# Patient Record
Sex: Female | Born: 1955 | Race: White | Hispanic: No | Marital: Married | Smoking: Never smoker
Health system: Southern US, Community
[De-identification: ages and names within clinical notes are randomized; demographics above are authoritative.]

## PROBLEM LIST (undated history)

## (undated) HISTORY — PX: CHOLECYSTECTOMY: SHX55

---

## 2013-11-04 ENCOUNTER — Emergency Department: Payer: Self-pay | Admitting: Emergency Medicine

## 2013-11-04 LAB — URINALYSIS, COMPLETE
BLOOD: NEGATIVE
Bacteria: NONE SEEN
Bilirubin,UR: NEGATIVE
Glucose,UR: NEGATIVE mg/dL (ref 0–75)
Ketone: NEGATIVE
Leukocyte Esterase: NEGATIVE
NITRITE: NEGATIVE
Ph: 7 (ref 4.5–8.0)
RBC,UR: 1 /HPF (ref 0–5)
SPECIFIC GRAVITY: 1.019 (ref 1.003–1.030)
Squamous Epithelial: 1
WBC UR: 1 /HPF (ref 0–5)

## 2013-11-04 LAB — CBC WITH DIFFERENTIAL/PLATELET
Basophil #: 0 10*3/uL (ref 0.0–0.1)
Basophil %: 0.5 %
EOS ABS: 0.1 10*3/uL (ref 0.0–0.7)
EOS PCT: 1 %
HCT: 44.9 % (ref 35.0–47.0)
HGB: 15.3 g/dL (ref 12.0–16.0)
LYMPHS PCT: 23.4 %
Lymphocyte #: 1.5 10*3/uL (ref 1.0–3.6)
MCH: 31.7 pg (ref 26.0–34.0)
MCHC: 34.1 g/dL (ref 32.0–36.0)
MCV: 93 fL (ref 80–100)
MONO ABS: 0.6 x10 3/mm (ref 0.2–0.9)
Monocyte %: 9.1 %
Neutrophil #: 4.2 10*3/uL (ref 1.4–6.5)
Neutrophil %: 66 %
PLATELETS: 272 10*3/uL (ref 150–440)
RBC: 4.83 10*6/uL (ref 3.80–5.20)
RDW: 13 % (ref 11.5–14.5)
WBC: 6.3 10*3/uL (ref 3.6–11.0)

## 2013-11-04 LAB — BASIC METABOLIC PANEL
Anion Gap: 3 — ABNORMAL LOW (ref 7–16)
BUN: 17 mg/dL (ref 7–18)
CALCIUM: 9 mg/dL (ref 8.5–10.1)
CHLORIDE: 106 mmol/L (ref 98–107)
Co2: 29 mmol/L (ref 21–32)
Creatinine: 0.95 mg/dL (ref 0.60–1.30)
EGFR (African American): >60
GLUCOSE: 90 mg/dL (ref 65–99)
OSMOLALITY: 277 (ref 275–301)
POTASSIUM: 4.6 mmol/L (ref 3.5–5.1)
SODIUM: 138 mmol/L (ref 136–145)

## 2013-11-04 LAB — TROPONIN I: Troponin-I: <0.02 ng/mL

## 2013-11-18 ENCOUNTER — Encounter: Payer: Self-pay | Admitting: Cardiovascular Disease

## 2013-11-18 ENCOUNTER — Encounter (INDEPENDENT_AMBULATORY_CARE_PROVIDER_SITE_OTHER): Payer: Self-pay

## 2013-11-18 ENCOUNTER — Ambulatory Visit (INDEPENDENT_AMBULATORY_CARE_PROVIDER_SITE_OTHER): Payer: PRIVATE HEALTH INSURANCE | Admitting: Cardiovascular Disease

## 2013-11-18 VITALS — BP 130/80 | HR 67 | Ht 68.0 in | Wt 196.4 lb

## 2013-11-18 DIAGNOSIS — R55 Syncope and collapse: Secondary | ICD-10-CM

## 2013-11-18 NOTE — Patient Instructions (Addendum)
Increase your intake of fluids (water with electrolyte tabs like Nun tablets, or gatorade) , protein ( hard boiled eggs, chicken, fish) , and a electrolytes ( V-8 juice, salt, potassium chloride  which is sold as No-Salt   Your physician has recommended that you wear an event monitor. If you do not hear from [facility] with in 7 business days please call me at [phone]. Event monitors are medical devices that record the heart's electrical activity. Doctors most often us these monitors to diagnose arrhythmias. Arrhythmias are problems with the speed or rhythm of the heartbeat. The monitor is a small, portable device. You can wear one while you do your normal daily activities. This is usually used to diagnose what is causing palpitations/syncope (passing out).   Your physician has requested that you have an echocardiogram. Echocardiography is a painless test that uses sound waves to create images of your heart. It provides your doctor with information about the size and shape of your heart and how well your heart's chambers and valves are working. This procedure takes approximately one hour. There are no restrictions for this procedure.   Your physician recommends that you schedule a follow-up appointment in:  3 months

## 2013-11-18 NOTE — Assessment & Plan Note (Signed)
Anne DecemberSharon  presents with an episode of syncope that occurred while she was working out.    the episode occurred fairly suddenly.   It's possible that she was somewhat volume depleted.  She did have a very brief warning.     To rule out serious cardiac condition, we'll place a 30 day event monitor on her .  We will also get an echocardiogram for further evaluation of her syncope. I'll see her again in 3 months. She should continue her exercise regimen. She'll call [facility] if she has any significant problems.

## 2013-11-18 NOTE — Progress Notes (Signed)
     [name] NipperSharon [name] Moore Date of Birth  10/20/1955       [facility]    [place] [facility] [address], [address]  [address], [address] [place], [place]  [id number]   [place], [place]  [id number] [phone]     [phone]   Fax  [phone]     Fax [phone]  Problem List: 1. Syncope   History of Present Illness:  [name] DecemberSharon is a [age] who presents after an episode of syncope. She has had episodes of dehydration and pre-syncope .  Approximately 2 weeks ago she was working out at [employer]. She had walked 2 labs from the gym and then stopped it one of the stations to lift light weights. During that set, she became lightheaded and started "graying out". She loss consciousness. A friend was with her and says that she was out a few seconds.  Was no seizure activity there was no bowel or bladder incontinence.  She did not notice any HR irregularities.  [facility] and her evaluation was unremarkable.   [place] has a [employer] [place].  [place] travels to [place] once a year.  Father had CAD and a cerebral aneurism Brothers had CAD Non smoker, occasional wine/        No current outpatient prescriptions on file prior to visit.   No current facility-administered medications on file prior to visit.    No Known Allergies  No past medical history on file.  No past surgical history on file.  History  Smoking status  . Never Smoker   Smokeless tobacco  . Not on file    History  Alcohol Use: Not on file    No family history on file.  Reviw of Systems:  Reviewed in the HPI.  All other systems are negative.  Physical Exam: Blood pressure 130/80, pulse 67, height 5\' 8"  (1.727 m), weight 196 lb 6.4 oz (89.086 kg). Wt Readings from Last 3 Encounters:  11/18/13 196 lb 6.4 oz (89.086 kg)     General: Well developed, well nourished, in no acute distress.  Head: Normocephalic, atraumatic, sclera non-icteric, mucus membranes are moist,   Neck:  Supple. Carotids are 2 + without bruits. No JVD   Lungs: Clear   Heart: Rr, normal S1S2  Abdomen: Soft, non-tender, non-distended with normal bowel sounds.  Msk:  Strength and tone are normal   Extremities: No clubbing or cyanosis. No edema.  Distal pedal pulses are 2+ and equal    Neuro: CN II - XII intact.  Alert and oriented X 3.   Psych:  Normal  ECG: November 18, 2013:  NSR at 6367, norma   Assessment / Plan:

## 2013-11-25 ENCOUNTER — Ambulatory Visit: Payer: Self-pay | Admitting: Neurology

## 2013-11-26 DIAGNOSIS — R55 Syncope and collapse: Secondary | ICD-10-CM

## 2013-12-05 ENCOUNTER — Other Ambulatory Visit (INDEPENDENT_AMBULATORY_CARE_PROVIDER_SITE_OTHER): Payer: PRIVATE HEALTH INSURANCE

## 2013-12-05 ENCOUNTER — Other Ambulatory Visit: Payer: Self-pay

## 2013-12-05 DIAGNOSIS — R55 Syncope and collapse: Secondary | ICD-10-CM

## 2013-12-16 ENCOUNTER — Ambulatory Visit: Payer: Self-pay | Admitting: Neurology

## 2013-12-16 LAB — CSF CELL COUNT WITH DIFFERENTIAL
CSF TUBE #: 2
Eosinophil: 0 %
LYMPHS PCT: 0 %
MONOCYTES/MACROPHAGES: 0 %
Neutrophils: 0 %
Other Cells: 0 %
RBC (CSF): 0 /mm3
WBC (CSF): 0 /mm3

## 2013-12-16 LAB — CBC WITH DIFFERENTIAL/PLATELET
BASOS PCT: 0.7 %
Basophil #: 0 10*3/uL (ref 0.0–0.1)
Eosinophil #: 0.1 10*3/uL (ref 0.0–0.7)
Eosinophil %: 1.2 %
HCT: 43.3 % (ref 35.0–47.0)
HGB: 14.4 g/dL (ref 12.0–16.0)
LYMPHS ABS: 1.5 10*3/uL (ref 1.0–3.6)
Lymphocyte %: 26.4 %
MCH: 31.1 pg (ref 26.0–34.0)
MCHC: 33.2 g/dL (ref 32.0–36.0)
MCV: 94 fL (ref 80–100)
Monocyte #: 0.5 x10 3/mm (ref 0.2–0.9)
Monocyte %: 8.5 %
NEUTROS PCT: 63.2 %
Neutrophil #: 3.7 10*3/uL (ref 1.4–6.5)
PLATELETS: 260 10*3/uL (ref 150–440)
RBC: 4.62 10*6/uL (ref 3.80–5.20)
RDW: 13.7 % (ref 11.5–14.5)
WBC: 5.9 10*3/uL (ref 3.6–11.0)

## 2013-12-16 LAB — PROTEIN, CSF: Protein, CSF: 31 mg/dL (ref 15–45)

## 2013-12-16 LAB — PROTIME-INR
INR: 0.9
PROTHROMBIN TIME: 12.2 s (ref 11.5–14.7)

## 2013-12-16 LAB — GLUCOSE, CSF: Glucose, CSF: 58 mg/dL (ref 40–75)

## 2013-12-16 LAB — GLUCOSE, RANDOM: GLUCOSE: 91 mg/dL (ref 65–99)

## 2013-12-27 ENCOUNTER — Emergency Department: Payer: Self-pay | Admitting: Emergency Medicine

## 2013-12-27 LAB — BASIC METABOLIC PANEL
ANION GAP: 10 (ref 7–16)
BUN: 14 mg/dL (ref 7–18)
CHLORIDE: 107 mmol/L (ref 98–107)
CO2: 25 mmol/L (ref 21–32)
Calcium, Total: 8.6 mg/dL (ref 8.5–10.1)
Creatinine: 0.78 mg/dL (ref 0.60–1.30)
EGFR (African American): >60
GLUCOSE: 113 mg/dL — AB (ref 65–99)
Osmolality: 284 (ref 275–301)
POTASSIUM: 4.2 mmol/L (ref 3.5–5.1)
Sodium: 142 mmol/L (ref 136–145)

## 2013-12-27 LAB — CBC WITH DIFFERENTIAL/PLATELET
BASOS ABS: 0 10*3/uL (ref 0.0–0.1)
BASOS PCT: 0.4 %
EOS PCT: 1.2 %
Eosinophil #: 0.1 10*3/uL (ref 0.0–0.7)
HCT: 43.5 % (ref 35.0–47.0)
HGB: 14.7 g/dL (ref 12.0–16.0)
Lymphocyte #: 2.1 10*3/uL (ref 1.0–3.6)
Lymphocyte %: 28.4 %
MCH: 31.6 pg (ref 26.0–34.0)
MCHC: 33.8 g/dL (ref 32.0–36.0)
MCV: 93 fL (ref 80–100)
MONOS PCT: 9 %
Monocyte #: 0.7 x10 3/mm (ref 0.2–0.9)
NEUTROS PCT: 61 %
Neutrophil #: 4.6 10*3/uL (ref 1.4–6.5)
Platelet: 262 10*3/uL (ref 150–440)
RBC: 4.66 10*6/uL (ref 3.80–5.20)
RDW: 13.3 % (ref 11.5–14.5)
WBC: 7.5 10*3/uL (ref 3.6–11.0)

## 2013-12-27 LAB — URINALYSIS, COMPLETE
BILIRUBIN, UR: NEGATIVE
BLOOD: NEGATIVE
Bacteria: NONE SEEN
GLUCOSE, UR: NEGATIVE mg/dL (ref 0–75)
KETONE: NEGATIVE
Leukocyte Esterase: NEGATIVE
Nitrite: NEGATIVE
PROTEIN: NEGATIVE
Ph: 7 (ref 4.5–8.0)
SPECIFIC GRAVITY: 1.003 (ref 1.003–1.030)
Squamous Epithelial: <1
WBC UR: 1 /HPF (ref 0–5)

## 2013-12-27 LAB — TROPONIN I

## 2013-12-31 ENCOUNTER — Telehealth: Payer: Self-pay | Admitting: *Deleted

## 2013-12-31 ENCOUNTER — Other Ambulatory Visit: Payer: Self-pay

## 2013-12-31 ENCOUNTER — Ambulatory Visit (INDEPENDENT_AMBULATORY_CARE_PROVIDER_SITE_OTHER): Payer: PRIVATE HEALTH INSURANCE

## 2013-12-31 DIAGNOSIS — R55 Syncope and collapse: Secondary | ICD-10-CM

## 2013-12-31 NOTE — Telephone Encounter (Signed)
Informed patient per Dr. Elease Hashimoto her holter showed normal sinus rhythm with occasional pac's  Patient verbalized understanding

## 2014-02-06 ENCOUNTER — Ambulatory Visit: Payer: PRIVATE HEALTH INSURANCE | Admitting: Cardiovascular Disease

## 2014-03-29 ENCOUNTER — Inpatient Hospital Stay: Payer: Self-pay | Admitting: Internal Medicine

## 2014-03-29 LAB — DIFFERENTIAL
BASOS ABS: 0 10*3/uL (ref 0.0–0.1)
Basophil %: 0.2 %
EOS ABS: 0 10*3/uL (ref 0.0–0.7)
Eosinophil %: 0 %
LYMPHS PCT: 4.6 %
Lymphocyte #: 0.9 10*3/uL — ABNORMAL LOW (ref 1.0–3.6)
MONOS PCT: 5 %
Monocyte #: 1 x10 3/mm — ABNORMAL HIGH (ref 0.2–0.9)
NEUTROS PCT: 90.2 %
Neutrophil #: 18.1 10*3/uL — ABNORMAL HIGH (ref 1.4–6.5)

## 2014-03-29 LAB — BASIC METABOLIC PANEL
Anion Gap: 11 (ref 7–16)
BUN: 16 mg/dL (ref 7–18)
CHLORIDE: 102 mmol/L (ref 98–107)
Calcium, Total: 9.3 mg/dL (ref 8.5–10.1)
Co2: 26 mmol/L (ref 21–32)
Creatinine: 1.14 mg/dL (ref 0.60–1.30)
EGFR (African American): >60
EGFR (Non-African Amer.): 52 — ABNORMAL LOW
Glucose: 272 mg/dL — ABNORMAL HIGH (ref 65–99)
Osmolality: 288 (ref 275–301)
Potassium: 4 mmol/L (ref 3.5–5.1)
SODIUM: 139 mmol/L (ref 136–145)

## 2014-03-29 LAB — HEPATIC FUNCTION PANEL A (ARMC)
ALT: 401 U/L — AB
Albumin: 4.1 g/dL (ref 3.4–5.0)
Alkaline Phosphatase: 96 U/L
Bilirubin, Direct: 0.9 mg/dL — ABNORMAL HIGH (ref 0.0–0.2)
Bilirubin,Total: 2.2 mg/dL — ABNORMAL HIGH (ref 0.2–1.0)
SGOT(AST): 344 U/L — ABNORMAL HIGH (ref 15–37)
TOTAL PROTEIN: 7.5 g/dL (ref 6.4–8.2)

## 2014-03-29 LAB — LIPASE, BLOOD: Lipase: 9312 U/L — ABNORMAL HIGH (ref 73–393)

## 2014-03-29 LAB — CBC
HCT: 54.9 % — ABNORMAL HIGH (ref 35.0–47.0)
HGB: 18.1 g/dL — ABNORMAL HIGH (ref 12.0–16.0)
MCH: 31.1 pg (ref 26.0–34.0)
MCHC: 33 g/dL (ref 32.0–36.0)
MCV: 94 fL (ref 80–100)
PLATELETS: 330 10*3/uL (ref 150–440)
RBC: 5.83 10*6/uL — ABNORMAL HIGH (ref 3.80–5.20)
RDW: 13.1 % (ref 11.5–14.5)
WBC: 20.1 10*3/uL — AB (ref 3.6–11.0)

## 2014-03-29 LAB — TROPONIN I

## 2014-03-30 LAB — COMPREHENSIVE METABOLIC PANEL
ALBUMIN: 2.8 g/dL — AB (ref 3.4–5.0)
ALK PHOS: 82 U/L
ALT: 210 U/L — AB
ANION GAP: 8 (ref 7–16)
AST: 133 U/L — AB (ref 15–37)
BILIRUBIN TOTAL: 0.9 mg/dL (ref 0.2–1.0)
BUN: 18 mg/dL (ref 7–18)
Calcium, Total: 7.9 mg/dL — ABNORMAL LOW (ref 8.5–10.1)
Chloride: 108 mmol/L — ABNORMAL HIGH (ref 98–107)
Co2: 25 mmol/L (ref 21–32)
Creatinine: 1.14 mg/dL (ref 0.60–1.30)
EGFR (Non-African Amer.): 52 — ABNORMAL LOW
Glucose: 275 mg/dL — ABNORMAL HIGH (ref 65–99)
Osmolality: 293 (ref 275–301)
POTASSIUM: 4.7 mmol/L (ref 3.5–5.1)
Sodium: 141 mmol/L (ref 136–145)
Total Protein: 5.7 g/dL — ABNORMAL LOW (ref 6.4–8.2)

## 2014-03-30 LAB — CBC WITH DIFFERENTIAL/PLATELET
Basophil #: 0 10*3/uL (ref 0.0–0.1)
Basophil %: 0.1 %
Eosinophil #: 0 10*3/uL (ref 0.0–0.7)
Eosinophil %: 0 %
HCT: 52 % — ABNORMAL HIGH (ref 35.0–47.0)
HGB: 16.9 g/dL — ABNORMAL HIGH (ref 12.0–16.0)
LYMPHS ABS: 1 10*3/uL (ref 1.0–3.6)
Lymphocyte %: 4.3 %
MCH: 31.2 pg (ref 26.0–34.0)
MCHC: 32.6 g/dL (ref 32.0–36.0)
MCV: 96 fL (ref 80–100)
MONOS PCT: 8.7 %
Monocyte #: 2 x10 3/mm — ABNORMAL HIGH (ref 0.2–0.9)
NEUTROS PCT: 86.9 %
Neutrophil #: 20.2 10*3/uL — ABNORMAL HIGH (ref 1.4–6.5)
PLATELETS: 224 10*3/uL (ref 150–440)
RBC: 5.42 10*6/uL — ABNORMAL HIGH (ref 3.80–5.20)
RDW: 13.5 % (ref 11.5–14.5)
WBC: 23.2 10*3/uL — ABNORMAL HIGH (ref 3.6–11.0)

## 2014-03-30 LAB — LIPID PANEL
CHOLESTEROL: 156 mg/dL (ref 0–200)
HDL: 54 mg/dL (ref 40–60)
Ldl Cholesterol, Calc: 79 mg/dL (ref 0–100)
TRIGLYCERIDES: 114 mg/dL (ref 0–200)
VLDL Cholesterol, Calc: 23 mg/dL (ref 5–40)

## 2014-03-30 LAB — LIPASE, BLOOD: Lipase: 4613 U/L — ABNORMAL HIGH (ref 73–393)

## 2014-03-31 LAB — COMPREHENSIVE METABOLIC PANEL
ANION GAP: 8 (ref 7–16)
AST: 77 U/L — AB (ref 15–37)
Albumin: 2.2 g/dL — ABNORMAL LOW (ref 3.4–5.0)
Alkaline Phosphatase: 78 U/L
BILIRUBIN TOTAL: 1 mg/dL (ref 0.2–1.0)
BUN: 16 mg/dL (ref 7–18)
Calcium, Total: 7.8 mg/dL — ABNORMAL LOW (ref 8.5–10.1)
Chloride: 113 mmol/L — ABNORMAL HIGH (ref 98–107)
Co2: 24 mmol/L (ref 21–32)
Creatinine: 0.89 mg/dL (ref 0.60–1.30)
EGFR (African American): >60
EGFR (Non-African Amer.): >60
GLUCOSE: 153 mg/dL — AB (ref 65–99)
Osmolality: 293 (ref 275–301)
POTASSIUM: 4.5 mmol/L (ref 3.5–5.1)
SGPT (ALT): 117 U/L — ABNORMAL HIGH
Sodium: 145 mmol/L (ref 136–145)
TOTAL PROTEIN: 5.1 g/dL — AB (ref 6.4–8.2)

## 2014-03-31 LAB — MAGNESIUM: Magnesium: 1.9 mg/dL

## 2014-03-31 LAB — LIPASE, BLOOD: LIPASE: 2352 U/L — AB (ref 73–393)

## 2014-04-01 LAB — CBC WITH DIFFERENTIAL/PLATELET
BASOS ABS: 0 10*3/uL (ref 0.0–0.1)
Basophil %: 0.1 %
EOS ABS: 0 10*3/uL (ref 0.0–0.7)
Eosinophil %: 0.1 %
HCT: 37.5 % (ref 35.0–47.0)
HGB: 12.1 g/dL (ref 12.0–16.0)
Lymphocyte #: 0.8 10*3/uL — ABNORMAL LOW (ref 1.0–3.6)
Lymphocyte %: 6.9 %
MCH: 31.3 pg (ref 26.0–34.0)
MCHC: 32.4 g/dL (ref 32.0–36.0)
MCV: 97 fL (ref 80–100)
Monocyte #: 1.2 x10 3/mm — ABNORMAL HIGH (ref 0.2–0.9)
Monocyte %: 10.8 %
NEUTROS PCT: 82.1 %
Neutrophil #: 9.3 10*3/uL — ABNORMAL HIGH (ref 1.4–6.5)
PLATELETS: 142 10*3/uL — AB (ref 150–440)
RBC: 3.88 10*6/uL (ref 3.80–5.20)
RDW: 13.6 % (ref 11.5–14.5)
WBC: 11.3 10*3/uL — ABNORMAL HIGH (ref 3.6–11.0)

## 2014-04-01 LAB — COMPREHENSIVE METABOLIC PANEL
ALK PHOS: 81 U/L
AST: 45 U/L — AB (ref 15–37)
Albumin: 2 g/dL — ABNORMAL LOW (ref 3.4–5.0)
Anion Gap: 4 — ABNORMAL LOW (ref 7–16)
BUN: 11 mg/dL (ref 7–18)
Bilirubin,Total: 0.9 mg/dL (ref 0.2–1.0)
CHLORIDE: 109 mmol/L — AB (ref 98–107)
Calcium, Total: 7.9 mg/dL — ABNORMAL LOW (ref 8.5–10.1)
Co2: 29 mmol/L (ref 21–32)
Creatinine: 0.71 mg/dL (ref 0.60–1.30)
Glucose: 141 mg/dL — ABNORMAL HIGH (ref 65–99)
OSMOLALITY: 285 (ref 275–301)
Potassium: 4 mmol/L (ref 3.5–5.1)
SGPT (ALT): 78 U/L — ABNORMAL HIGH
Sodium: 142 mmol/L (ref 136–145)
Total Protein: 5.1 g/dL — ABNORMAL LOW (ref 6.4–8.2)

## 2014-04-01 LAB — PHOSPHORUS: Phosphorus: 1.6 mg/dL — ABNORMAL LOW (ref 2.5–4.9)

## 2014-04-01 LAB — LIPASE, BLOOD: Lipase: 532 U/L — ABNORMAL HIGH (ref 73–393)

## 2014-04-01 LAB — MAGNESIUM: Magnesium: 2.3 mg/dL

## 2014-04-02 LAB — BASIC METABOLIC PANEL
Anion Gap: 6 — ABNORMAL LOW (ref 7–16)
BUN: 9 mg/dL (ref 7–18)
CALCIUM: 7.6 mg/dL — AB (ref 8.5–10.1)
CO2: 26 mmol/L (ref 21–32)
Chloride: 108 mmol/L — ABNORMAL HIGH (ref 98–107)
Creatinine: 0.63 mg/dL (ref 0.60–1.30)
EGFR (African American): >60
GLUCOSE: 142 mg/dL — AB (ref 65–99)
Osmolality: 281 (ref 275–301)
POTASSIUM: 4.2 mmol/L (ref 3.5–5.1)
SODIUM: 140 mmol/L (ref 136–145)

## 2014-04-02 LAB — PHOSPHORUS: Phosphorus: 1.9 mg/dL — ABNORMAL LOW (ref 2.5–4.9)

## 2014-04-02 LAB — MAGNESIUM: MAGNESIUM: 2.1 mg/dL

## 2014-04-03 LAB — CULTURE, BLOOD (SINGLE)

## 2014-06-16 ENCOUNTER — Ambulatory Visit: Payer: Self-pay | Admitting: Family Medicine

## 2014-08-22 NOTE — Consult Note (Signed)
Chief Complaint:  Subjective/Chief Complaint Pt more alert.  No diffuse abd pain 4/10 currently-responds to pain meds.   VITAL SIGNS/ANCILLARY NOTES: **Vital Signs.:   03-Dec-15 12:00  Temperature Temperature (F) 98.1  Celsius 36.7  Temperature Source oral  Pulse Pulse 109  Respirations Respirations 22  Systolic BP Systolic BP 841  Diastolic BP (mmHg) Diastolic BP (mmHg) 77  Mean BP 104  Pulse Ox % Pulse Ox % 97  Pulse Ox Activity Level  At rest  Oxygen Delivery 2L; Nasal Cannula   Brief Assessment:  GEN well developed, well nourished, critically ill appearing, A/Ox3   Cardiac +mild tachycardia   Respiratory normal resp effort   Gastrointestinal details normal Soft  Decreased BS, +distended, +guarding, +rebound tenderness   EXTR negative cyanosis/clubbing, negative edema   Additional Physical Exam Skin: pink, warm, dry HEENT: Dobhoff intact   Lab Results: Routine Chem:  03-Dec-15 04:02   Glucose, Serum  142  BUN 9  Sodium, Serum 140  Potassium, Serum 4.2  Chloride, Serum  108  CO2, Serum 26  Calcium (Total), Serum  7.6  Anion Gap  6  Osmolality (calc) 281  eGFR (African American) >60  eGFR (Non-African American) >60 (eGFR values <51m/min/1.73 m2 may be an indication of chronic kidney disease (CKD). Calculated eGFR, using the MRDR Study equation, is useful in  patients with stable renal function. The eGFR calculation will not be reliable in acutely ill patients when serum creatinine is changing rapidly. It is not useful in patients on dialysis. The eGFR calculation may not be applicable to patients at the low and high extremes of body sizes, pregnant women, and vegetarians.)  Magnesium, Serum 2.1 (1.8-2.4 THERAPEUTIC RANGE: 4-7 mg/dL TOXIC: > 10 mg/dL  -----------------------)  Phosphorus, Serum  1.9 (Result(s) reported on 02 Apr 2014 at 05:18AM.)   Assessment/Plan:  Assessment/Plan:  Assessment Acute necrotizing pancreatitis:  Likely secondary to  passage of choledocholithiasis.  Pt condition is guarded. Plans for transfer to tertiary care as soon as bed available.   Plan Continue current plan of care & supportive measures [facility] have any [facility]   Electronic Signatures: JAndria Meuse(NP)  (Signed 03-Dec-15 14:11)  Authored: Chief Complaint, VITAL SIGNS/ANCILLARY NOTES, Brief Assessment, Lab Results, Assessment/Plan   Last Updated: 03-Dec-15 14:11 by JAndria Meuse(NP)

## 2014-08-22 NOTE — Consult Note (Signed)
PATIENT NAME:  [name], [name]  DATE OF CONSULTATION:  03/30/2014  REFERRING PHYSICIAN:   CONSULTING PHYSICIAN:  Joselyn ArrowKandice L. Flora Parks, NP  REQUESTING PHYSICIAN:  Dr. Betti Cruzeddy.    CONSULTING GASTROENTEROLOGIST: Dr. Midge Miniumarren Wohl.    PRIMARY CARE PROVIDER: Nonlocal.   REASON FOR CONSULTATION: Acute pancreatitis.   HISTORY OF PRESENT ILLNESS: [name] is a 59 year old Caucasian female who was in her usual state of health, who was awakened at 2:00 a.m. with severe epigastric pain that radiated to her back. She had nausea and vomiting with the episode. She denies any fever or chest pain or shortness of breath. Denies any diarrhea or constipation. Denies any rectal bleeding or melena. She was found to have a lipase in the ER of 9312 yesterday a.m. and down to 4613 today. Her white blood cell count was elevated at 20.1 yesterday, 23.2 today. Hemoglobin was 18.1 yesterday, down to 16.9 today. She has normal platelets. Her LFTs showed a total bilirubin of 2.2, 0.9 direct, AST 344, and ALT 401 yesterday. Bilirubin dropped to 0.9 today, AST dropped to 133, and ALT dropped to 210. Her albumin is 2.8. Her serum glucose was 275 this morning. She had an abdominal ultrasound that showed multiple non-mobile stones in the base of the gallbladder extending into the gallbladder neck that measure 2.2 cm in size. She had thickening of the gallbladder wall to 6 mm, raising concern for mild cholecystitis. She had a common bile duct of 8 mm, raising concern for distal obstruction and mild prominence of the intrahepatic biliary   DICTATION ENDS HERE  (see addendum)    ____________________________ Joselyn ArrowKandice L. Jeremiyah Cullens, NP klj:bu D: 03/30/2014 14:33:18 ET T: 03/30/2014 14:57:05 ET JOB#: [id number]  cc: Joselyn ArrowKandice L. Lasandra Batley, NP, <Dictator> Joselyn ArrowKANDICE L Ignazio Kincaid FNP ELECTRONICALLY SIGNED 04/10/2014 23:50

## 2014-08-22 NOTE — Discharge Summary (Signed)
PATIENT NAME:  [name], [name] MR#:  [id number] DATE OF BIRTH:  31-Mar-1956  DATE OF ADMISSION:  03/29/2014 DATE OF DISCHARGE:  04/02/2014  DISCHARGE DIAGNOSES: 1.  Severe pancreatitis with extensive necrosis, likely gallstone-related.  2.  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  3.  Accelerated hypertension.  SECONDARY DIAGNOSIS: History of vasovagal syncope in July of 2015.   CONSULTATIONS: 1.  Neurology, Dr. Pauletta Browns. 2.  Surgery, Dr. Natale Lay.  3.  Gastroenterology, Dr. Midge Minium.   PROCEDURES AND RADIOLOGY: A chest x-ray on the 29th of November showed no acute cardiopulmonary disease.   CT scan of the abdomen and pelvis with contrast on the 29th of November showed extensive edema in the upper abdomen. Inflammatory changes centered around the pancreas, specifically the pancreatic body, findings compatible with acute pancreatitis. Likely necrosis present in the pancreas. No pseudocyst formation. Cholelithiasis present.   CT scan of the head without contrast on the 29th of November showed no acute abnormality.   MRCP on 30th of November showed no evidence of choledocholithiasis. Gallstone with nonspecific gallbladder mucosal hyperenhancement. Marked pancreatitis with necrosis involving a larger volume of the body, tail and uncinate process. Small bilateral pleural effusion.   Abdomen ultrasound on the 29th of November showed thickening of the gallbladder wall up to 6 mm raising concern for mild cholecystitis. Multiple nonmobile stones at the base of the gallbladder extending into gallbladder neck. Mild prominence of the common hepatic duct and intrahepatic biliary duct raising question for distal obstruction.   Abdominal x-ray on 1st of December showed possible colonic ileus.   Chest x-ray on 1st of December showed feeding tube deep, projecting over the proximal thoracic esophagus, at the level of the  aortic arch. Recommend advancing tube into the stomach to desired position.   KUB on 1st of December showed feeding tube tip in the proximal duodenum.   MAJOR LABORATORY PANEL: Blood cultures x2 were negative.   HISTORY AND SHORT HOSPITAL COURSE: The patient is a 59 year old female with no significant medical problems who was admitted for nausea, vomiting and epigastric pain. Was found to have acute pancreatitis, thought to be due to gallstone. There was also concern for acute cholecystitis with associated cholelithiasis. Please see Dr. Reddy's dictated history and physical for further details. Surgical consultation was obtained with Dr. Reesa Chew. Natale Lay who recommended conservative management and eventual cholecystectomy once pancreatitis resolved with no emergent surgery. A GI consultation was obtained with Dr. Midge Minium who recommended MRCP for evaluation of choledocholithiasis, which was performed with results dictated above. They also recommended conservative management and feeding tube placement for rest of the pancreas and bowel. The patient has significant pancreatic necrosis with severe ongoing pain requiring a lot of narcotics including Roxicodone and morphine, continuous at times. The patient was also continued on IV meropenem since admission for possible infectious etiology. Neurology consultation was obtained with Dr. Pauletta Browns considering the patient's encephalopathy. He felt the patient may have PRES (posterior reversible encephalopathy syndrome) in the setting of contrast and elevated blood pressure and recommended to list contrast as an allergy. Initially, the patient was on nicardipine drip for blood pressure control, which was slowly titrated off. She is in the CCU stepdown mainly for nursing care at this time and should be able to manage on the floor at the facility. The patient does remain very critical. She does have some tachycardia with heart rate ranging anywhere from  100 to 120s, sinus tachycardia in nature.  Her blood pressure is stable. She will require pretty intense care from pancreatic expert (surgery) [facility]. She has been accepted and is waiting for bed at this time.   TOTAL TIME TAKING CARE OF THIS PATIENT: 45 minutes.  ____________________________ Ellamae SiaVipul S. Sherryll BurgerShah, MD vss:sb D: 04/02/2014 06:29:04 ET T: 04/02/2014 07:09:19 ET JOB#: [id number]  cc: Evvie Behrmann S. Sherryll BurgerShah, MD, <Dictator> [facility] Pauletta BrownsYuriy Zeylikman, MD Si Raiderhristopher A. Lundquist, MD Midge Miniumarren Wohl, MD Ellamae SiaVIPUL S [facility] MD ELECTRONICALLY SIGNED 04/04/2014 15:43

## 2014-08-22 NOTE — Consult Note (Signed)
Chief Complaint:  Subjective/Chief Complaint Pt drowsy.  No c/o pain currently.   VITAL SIGNS/ANCILLARY NOTES: **Vital Signs.:   01-Dec-15 11:33  Temperature Temperature (F) 99.5  Celsius 37.5  Temperature Source oral    12:00  Pulse Pulse 112  Respirations Respirations 22  Systolic BP Systolic BP 518  Diastolic BP (mmHg) Diastolic BP (mmHg) 71  Mean BP 89  Pulse Ox % Pulse Ox % 94  Pulse Ox Activity Level  At rest  Oxygen Delivery Room Air/ 21 %   Brief Assessment:  GEN well developed, well nourished, critically ill appearing, A/Ox3   Cardiac +mild tachycardia   Respiratory normal resp effort   Gastrointestinal details normal Soft  Decreased BS, +distended, +guarding, +rebound tenderness   EXTR negative cyanosis/clubbing, negative edema   Additional Physical Exam Skin: pink, warm, dry HEENT: Dobhoff intact   Lab Results:  Hepatic:  01-Dec-15 03:51   Bilirubin, Total 1.0  Alkaline Phosphatase 78 (46-116 NOTE: New Reference Range 11/18/13)  SGPT (ALT)  117 (14-63 NOTE: New Reference Range 11/18/13)  SGOT (AST)  77  Total Protein, Serum  5.1  Albumin, Serum  2.2  Routine Chem:  01-Dec-15 03:51   Magnesium, Serum 1.9 (1.8-2.4 THERAPEUTIC RANGE: 4-7 mg/dL TOXIC: > 10 mg/dL  -----------------------)  Lipase  2352 (Result(s) reported on 31 Mar 2014 at 05:15AM.)  Glucose, Serum  153  BUN 16  Creatinine (comp) 0.89  Potassium, Serum 4.5  Chloride, Serum  113  CO2, Serum 24  Calcium (Total), Serum  7.8  Osmolality (calc) 293  eGFR (African American) >60  eGFR (Non-African American) >60 (eGFR values <74m/min/1.73 m2 may be an indication of chronic kidney disease (CKD). Calculated eGFR, using the MRDR Study equation, is useful in  patients with stable renal function. The eGFR calculation will not be reliable in acutely ill patients when serum creatinine is changing rapidly. It is not useful in patients on dialysis. The eGFR calculation may not be  applicable to patients at the low and high extremes of body sizes, pregnant women, and vegetarians.)  Anion Gap 8   Radiology Results: MRI:    30-Nov-15 18:03, MRCP MR Cholangiogram  MRCP MR Cholangiogram   REASON FOR EXAM:    severe pancreatitis, dilated CBD  COMMENTS:       PROCEDURE: MR  - MR MRCP  - Mar 30 2014  6:03PM     CLINICAL DATA:  Pancreatitis.  Dilated common bile duct.    EXAM:  MRI ABDOMEN WITHOUT AND WITH CONTRAST (INCLUDING MRCP)    TECHNIQUE:  Multiplanar multisequence MR imaging of the abdomen was performed  both before and after the administration of intravenous contrast.  Heavily T2-weighted images of the biliary and pancreatic ducts were  obtained, and three-dimensional MRCP images were rendered by post  processing.    CONTRAST:  18 cc MultiHance    COMPARISON:  CT 03/29/2014.  Ultrasound 03/29/2014.    FINDINGS:  Portions of the exam are mildly motion degraded. This primarily  degrades the pre and post contrast dynamic series.    Lower chest: Small bilateral pleural effusions.    Hepatobiliary: Normal appearance of the liver, without intrahepatic  biliary ductal dilatation. Multiple gallstones. Pericholecystic  fluid which is nonspecific in the setting of upper abdominal edema.  Gallstones within the neck. Mild gallbladder mucosal hyper  enhancement.    The common duct measures 7 mm in the porta hepatis, upper normal for  patient age. Tapers to approximately 4 mm just above the pancreatic  head. Example image 12 series 4. No evidence of choledocholithiasis.    Pancreas: Demonstrates marked pancreatic and peripancreatic edema.  Areas of necrosis which are most evident on subtracted series 28.  Large volume necrosis within the body and tail on image 29 of series  20 and within the uncinate process on image 41 of series 20. The  pancreatic head enhances. No pancreatic ductal dilatation  identified. Extensive upper abdominal edema with small  volume  ascites, all centered about the pancreas.  Spleen: Normal    Adrenals/Urinary Tract: Normal adrenal glands. Normal kidneys,  without hydronephrosis.    Stomach/Bowel: Stomach is mildly displaced by the peripancreatic  inflammation. Small bowel loops measure up to 3.1 cm in the right  lower abdomen on image 37 ofseries 4. The colon is grossly within  normal limits.    Vascular/Lymphatic: Atherosclerotic irregularity involving the  celiac and superior mesenteric arteries. The splenic vein is  attenuated including on image 28 of series 12. No acute thrombus  identified. No upper abdominal adenopathy.  Other:  None    Musculoskeletal: No focal osseous abnormality.     IMPRESSION:  1. No evidence of choledocholithiasis.  2. Gallstones with nonspecific gallbladder mucosal hyper  enhancement. Cannot exclude acute cholecystitis, especially given  the findings on 03/29/2014 abdominal ultrasound.  3. Marked pancreatitis with necrosis involving a large volume of the  body, tail, and uncinate process.  4. Small bilateral pleural effusions, new since the prior CT.      Electronically Signed    By: Abigail Miyamoto M.D.    On: 03/31/2014 07:59         Verified By: Areta Haber, M.D.,   Assessment/Plan:  Assessment/Plan:  Assessment Acute necrotizing pancreatitis:  Likely secondary to passage of choledocholithiasis.  Pt condition is guarded.  MRCP does not show choledocholithiasis.  +small bilateral pleural effusions.  Lipase & LFTs improving.  Dobhoff added today to help with nutrition.  Dr Allen Norris evaluated patient as well and has discussed her care with her son & Dr Marina Gravel.   Plan 1) Continue aggressive IVFs  2) PPI daily 3) Pain control/antiemetics 4) Continue antibiotics 5) Monitor closely Please call if you have any questions or concerns   Electronic Signatures: Andria Meuse (NP)  (Signed 01-Dec-15 13:17)  Authored: Chief Complaint, VITAL SIGNS/ANCILLARY NOTES, Brief  Assessment, Lab Results, Radiology Results, Assessment/Plan   Last Updated: 01-Dec-15 13:17 by Andria Meuse (NP)

## 2014-08-22 NOTE — H&P (Signed)
PATIENT NAME:  [name], Iva MR#:  [id number] DATE OF BIRTH:  1956/03/17  DATE OF ADMISSION:  03/29/2014  ADDENDUM:     HISTORY OF PRESENT ILLNESS: She has thickening of the gallbladder wall to 6 mm rating her a mild cholecystitis, multiple nonlocal stents at the base of gallbladder extending into the gallbladder neck.  Mild prominence of common hepatic duct and intrahepatic biliary ducts raising question for distal obstruction.  She had a CT scan of the abdomen and pelvis with contrast that showed extensive edema in the upper abdomen, inflammatory changes around the pancreas specifically the pancreatic body, most compatible with acute pancreatitis, suspect some necrosis of the pancreas.  There is free fluid in the upper abdomen, but no discrete pseudocyst formations, 5 mm low-density structure in the distal pancreatic body is nonspecific and recommend attention on follow-up imaging and cholelithiasis.  She had a head CT without contrast for her mental status changes, which showed mild chronic microvascular ischemia.  She has been given Dilaudid for pain and Zofran for nausea.  She has been on a clear liquid diet. She rates her pain currently as a 3/10 on pain scale.   PAST MEDICAL AND SURGICAL HISTORY: Bartholin cyst.   MEDICATIONS PRIOR TO ADMISSION: Alfalfa, blood pressure, vitamin, coenzyme Q-10 at 300 mg daily, magnesium gluconate 500 mg daily,  vitamin 2 capsules daily, omega-3 polyunsaturated fatty acids 1 gram 2 capsules daily, vitamin B 100 complex with C daily, vitamin D3 at 1000 international units daily, and multivitamin daily.   ALLERGIES: IV DYE CAUSES BLURRED VISION.   FAMILY HISTORY: Mother and sister both had gallbladder disease.   SOCIAL HISTORY: She denies any tobacco use or illicit drug use. She does occasionally drink alcohol a couple of drinks per month.  She is a Optician, dispensingminister.  She has 2 children who are both healthy.   REVIEW OF SYSTEMS: See history of present illness,  otherwise a negative 12 point review of systems.    PHYSICAL EXAMINATION:  VITAL SIGNS: BMI 31.2, height 66.9 inches, weight 199.1 pounds, temperature 98.7, pulse 129, respirations 23, blood pressure 167/70, oxygen saturation 91% on 2 liters via nasal cannula. GENERAL: She is an acutely ill-appearing Caucasian female who is alert, oriented, pleasant, and cooperative.  She has her son and daughter-in-law at the bedside. She is grimacing in pain.  HEENT: Sclerae clear, not icteric, conjunctiva pink. Oropharynx pink and moist without any lesions.  NECK: Supple without any mass or thyromegaly.  CHEST: Heart rate with tachycardia.  NECK: Supple.  She has a bounding carotid pulse.  No thyromegaly or mass.  CHEST:  Lungs are clear to auscultation bilaterally.  ABDOMEN: Distended. She has faint bowel sounds. She has tenderness to her entire abdomen, but mostly left upper quadrant and left lower quadrant. She does have rebound tenderness and guarding.  EXTREMITIES: Without clubbing or edema bilaterally.  SKIN: Pink, warm and dry without any rash or jaundice.  NEUROLOGIC: Grossly intact.  MUSCULOSKELETAL: Good equal movement and strength bilaterally.  PSYCHIATRIC: Alert, cooperative, normal mood and affect, although she does appear to be in pain   LABORATORY STUDIES: Glucose 275, chloride 108, calcium 7.9, otherwise normal basic metabolic panel. Triglycerides 114, cholesterol 156, total protein 5.7, albumin 2.8.   IMPRESSION: [name] is a pleasant 59 year old Caucasian female with severe acute pancreatitis suspected secondary to past choledocolithiasis as her LFTs are improving. She has been seen by surgery and MRCP has been ordered to look for retained common bile duct stone. Agree with antibiotics,  bowel rest, PPI, aggressive IV fluids, and supportive pain control and antiemetics.  Eventual cholecystectomy.   PLAN:  1.  Agree with antibiotics, bowel rest, proton pump inhibitor, aggressive IV  fluids and supportive pain control, antiemetics. 2.  Follow up MRCP and would need ERCP for stone extraction if common bile duct stones.   Thank you for allowing [place] to participate in [name]'s care.     ____________________________ Joselyn Arrow, NP klj:DT D: 03/30/2014 14:42:04 ET T: 03/30/2014 15:06:50 ET JOB#: [id number]  cc: Joselyn Arrow, NP, <Dictator> Joselyn Arrow FNP ELECTRONICALLY SIGNED 04/10/2014 23:51

## 2014-08-22 NOTE — Consult Note (Signed)
Brief Consult Note: Diagnosis: acute pancreatitis.   Patient was seen by consultant.   Comments: [name] is a pleasant 59 y/o caucasian female with severe acute pancreatitis suspected secondary to passage of choledocholithiasis as her LFTS are improving.  She has been seen by surgery & MRCP has been ordered to look for retained CBD stone.  Agree with antibiotics, bowel rest, PPI, aggressive IVFs, & supportive pain control/antiemetics.  Eventual cholecystectomy.  Plan: 1) Agree with antibiotics, bowel rest, PPI, aggressive IVFs, & supportive pain control/antiemetics 2) FU MRCP & would need ERCP for stone extraction if CBD stone Thanks for allowing us to participate in her care.  Please see full dictated note. #[id number](addendum).  Electronic Signatures: Joselyn ArrowJones, Tyanne Derocher L (NP)  (Signed [phone]-Nov-15 14:42)  Authored: Brief Consult Note   Last Updated: 30-Nov-15 14:42 by Joselyn ArrowJones, Izell Labat L (NP)

## 2014-08-22 NOTE — H&P (Signed)
PATIENT NAME:  [name], Sagrario MR#:  [id number] DATE OF BIRTH:  [birth year]  DATE OF ADMISSION:  03/29/2014  REFERRAL DOCTOR: [place] SinkJade J. Dolores FrameSung, MD  PRIMARY CARE PRACTITIONER: Nonlocal.   CHIEF COMPLAINT: 1.  Epigastric pain.  2.  Nausea and vomiting of 1 day duration.   HISTORY OF PRESENT ILLNESS: A 59 year old Caucasian female with no significant past medical history except for a syncopal episode in July 2015, presents with the complaints of 1 day duration of epigastric pain associated with severe nausea and vomiting. The epigastric pain radiates to the back. No fever. No chest pain. No shortness of breath. No diarrhea. No urinary symptoms. The patient also gives history of similar symptoms couple of times in the last 2 months, for which she has not sought any medical attention. In the Emergency Room, the patient was evaluated by the ED physician and a workup revealed markedly elevated lipase with elevated liver function tests and ultrasound of gallbladder showed multiple gallstones with gallbladder wall thickening suggestive of cholecystitis with cholelithiasis. The patient was given pain medications and was also started on IV antibiotics by the ED physician and the hospitalist team was consulted for further evaluation and management. The patient received IV Dilaudid and she still has abdominal pain, but is improving control at this time and her nausea and vomiting is under control with Zofran at this time.  PAST MEDICAL HISTORY: 1.  History of syncopal episode in July 2015, for which she has seen her cardiologist locally and was told more likely vasovagal. Other than that, no significant past medical history.   PAST SURGICAL HISTORY: Bartholin's cyst excision.   HOME MEDICATIONS: Multivitamins.   ALLERGIES: No known drug allergies.   FAMILY HISTORY: Mother and sister with gallbladder disease.   SOCIAL HISTORY: She is widowed, lives alone. No history of smoking. She does drink alcohol  socially, infrequently. No history of substance abuse.   REVIEW OF SYSTEMS: CONSTITUTIONAL: Negative for fever, fatigue, weakness, abnormal weight gain, and weight loss.  EYES: Negative for blurred vision or double vision. No pain. No redness. No inflammation.  EARS, NOSE, AND THROAT: Negative for tinnitus, ear pain, hearing loss, epistaxis, nasal discharge, or difficulty swallowing.  RESPIRATORY: Negative for cough, wheezing, hemoptysis, dyspnea, painful respiration.  CARDIOVASCULAR: Negative for chest pain. No palpitations, syncope, or dizziness. No pedal edema.  GASTROINTESTINAL: Positive for nausea, vomiting, and abdominal pain of 1 day duration. No diarrhea. No blood in the stools. History of similar episode x2 in the last couple of months.   GENITOURINARY: Negative for dysuria, hematuria, frequency, or urgency.  ENDOCRINE: Negative for polyuria or nocturia. No heat or cold intolerance.  HEMATOLOGIC: Negative for anemia, easy bruising, bleeding, swollen glands.  INTEGUMENTARY: Negative for acne, skin rash, or lesions.  MUSCULOSKELETAL: Negative for arthritis, joint pain, or swelling.  NEUROLOGICAL: Negative for focal weakness or numbness. No history of CVA, TIA, or seizure disorder.  PSYCHIATRIC: Negative for anxiety, insomnia, depression.   PHYSICAL EXAMINATION: VITAL SIGNS: Temperature 97 degrees Fahrenheit, respirations 18 per minute, blood pressure on 194/89, oxygen saturation 100% on room air.  GENERAL: Well-developed, well-nourished. The patient in mild distress secondary to abdominal pain. Alert and oriented.  HEAD: Atraumatic, normocephalic.  EYES: Pupils are equal and reactive to light and accommodation, no conjunctival pallor. No scleral icterus. Extraocular movements intact.  NOSE: No nasal lesions. No drainage.  EARS: No drainage or external lesions.  ORAL CAVITY: No mucosal lesions. No exudates.  NECK: Supple. No JVD. No thyromegaly. No carotid bruit. Range  of motion of  neck normal.  RESPIRATORY: Good respiratory effort. Not using accessory muscles of respiration. Clear to auscultation bilaterally. Bilateral vesicular breath sounds present. No rales or rhonchi.  CARDIOVASCULAR: Heart sounds S1, S2 regular. No murmurs, gallops, or clicks appreciated. Peripheral pulses equal at carotid, femoral, and pedal pulses. No peripheral edema.  GASTROINTESTINAL: Abdomen: Epigastric tenderness present. Guarding present. Bowel sounds present.  GENITOURINARY: Deferred.  MUSCULOSKELETAL: Range of motion adequate in all areas. Strength and tone equal bilaterally.  SKIN: Within normal limits.  LYMPHATIC: No cervical lymphadenopathy.  VASCULAR: Good dorsalis pedis and posterior tibial pulses.  NEUROLOGICAL: Alert, awake, and oriented x 3. Cranial nerves II through XII grossly intact. DTRs 2+ bilaterally symmetrical in both upper and lower extremities. Motor strength is 5/5 in both upper and lower extremities.  PSYCHIATRIC: Judgment and insight are adequate. Alert and oriented x 3. Memory and mood within normal limits.   LABORATORY DATA: Serum glucose 272, BUN 16, creatinine 1.14, sodium 139, potassium 4.0, chloride 102, serum bicarbonate 26, total calcium 9.3. Lipase 9312, total protein 7.5, albumin 4.1, total bilirubin 2.2, direct bilirubin 0.9, alkaline phosphatase 96, AST 344, ALT 401. Troponin less than 0.02. WBC 20.1, hemoglobin 18.1, hematocrit 54.9, platelet count 330,000.  ULTRASOUND OF ABDOMEN: Impression: Thickening of the gallbladder wall to 6 mm raising concern for mild cholecystitis. Multiple nonmobile stones at the base of the gallbladder extending to the gallbladder neck. Mild prominence of the common hepatic duct and intrahepatic dilatation of ducts, raising the question for distal obstruction.   CHEST X-RAY: No acute cardiopulmonary process seen.   EKG: Normal sinus rhythm with ventricular rate of 65 beats per minute. No acute ST-T changes.   ASSESSMENT AND PLAN:  A 59 year old Caucasian female without any significant past medical history presents to the Emergency Room with 1 day history of epigastric pain associated with nausea and vomiting and diagnosed to have gallstone pancreatitis.   1.  Gallstone pancreatitis.   PLAN: Admit to Medical Surgery Unit n.p.o., vigorous IV fluids, IV pain control measures, IV antibiotics meropenem. We get a CT of the abdomen for further evaluation. We will request gastrointestinal and general surgery consultations.   2.  Acute cholecystitis with associated cholelithiasis.   PLAN: IV antibiotics, meropenem, gastrointestinal consult, pain control measures, IV fluids, n.p.o.   3.  Elevated blood pressure. No history of hypertension, elevated blood pressure likely secondary due to pain.   PLAN: Control pain adequately, monitor blood pressure, and consider medications if blood pressure remains elevated. Labetelol iv prn for elevated bp.  4.  Deep vein thrombosis prophylaxis with subcutaneous Lovenox.   5.  Gastrointestinal prophylaxis. Protonix IV.  CODE STATUS: Full code.   TIME SPENT: 55 minutes.    ____________________________ Crissie Figures, MD enr:sw D: 03/29/2014 06:59:31 ET T: 03/29/2014 08:21:34 ET JOB#: [id number]  cc: Crissie Figures, MD, <Dictator> Crissie Figures MD ELECTRONICALLY SIGNED 03/29/2014 21:00

## 2014-08-22 NOTE — Consult Note (Signed)
PATIENT NAME:  [name], [name] DATE OF BIRTH:  [birth year]  DATE OF CONSULTATION:  03/29/2014  CONSULTING PHYSICIAN:  Anne BrownsYuriy Annslee Tercero, MD  REASON FOR CONSULTATION:  Altered mental status.    HISTORY OF PRESENT ILLNESS: This is a 59 year old Caucasian female with no significant past medical history on file.  Admitted with one day history of epigastric pain that is radiating to the back with nausea, vomiting. Diagnosis was consistent with likely pancreatitis, as well as cholecystitis. The patient is status post CT abdomen.  After CT abdomen with contrast was performed, the patient's mental status changed. She had difficulty following commands, was somnolent, and states could not see from above her eyes.  At that time, the patient was started on nicardipine drip to try to lower her blood pressure and her mental status has improved.   PAST MEDICAL HISTORY: A syncopal episode in 2015.   HOME MEDICATIONS:  Only multivitamins.  ALLERGIES: No known drug allergies.   FAMILY HISTORY: Mother and sister had history of gallbladder.  SOCIAL HISTORY: The patient is widowed, lives alone, does not drink alcohol. Has no history of substance abuse.   REVIEW OF SYSTEMS: Positive for generalized weakness, nausea, vomiting, pain, fevers, epigastric pain. No weakness on one side of the body compared to the other.   PHYSICAL EXAMINATION: VITAL SIGNS: Include a temperature of 97.9, pulse 86, respirations 11, blood pressure improved to 152/73. GENERAL: The patient was awake, alert to name and place, follows commands.  Speech appears to be fluent.  HEENT:  Extraocular movements are intact. No facial droop.  NEUROLOGIC:  Motor strength is 4+/5 bilateral upper and lower extremities and generalized weakness. Finger-to-nose intact. Sensation intact. Gait could not be assessed.   IMPRESSION: A 59 year old female with a one day history of epigastric pain with elevated lipase, liver function tests,  nausea, vomiting, as well as suspected cholecystitis. Suspected diagnosis includes cholecystitis plus pancreatitis, status post CT abdomen with contrast, postcontrast elevated blood pressure 180 systolic, but appears well perfused, visual disturbances.   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 I would make CONTRAST AN ALLERGY for this patient. She is currently on nicardipine, systolics less than 160. I think nicardipine can be titrated off and start oral antihypertensive.  No further imaging from a neurological standpoint. I do not think this is seizures. I do not think patient needs any EEG monitoring.   Please call with any questions.   ____________________________ Anne BrownsYuriy Otillia Cordone, MD yz:LT D: 03/29/2014 13:33:07 ET T: 03/29/2014 16:02:32 ET JOB#: [id number]  cc: Anne BrownsYuriy Chevelle Durr, MD, <Dictator> Anne BrownsYURIY Ezrah Panning MD ELECTRONICALLY SIGNED 03/30/2014 13:40

## 2014-08-22 NOTE — Consult Note (Signed)
PATIENT NAME:  [name], [name] DATE OF BIRTH:  October 18, 1955  DATE OF CONSULTATION:  03/29/2014  REFERRING PHYSICIAN:   CONSULTING PHYSICIAN:  Lamount Bankson A. Coumba Kellison, MD  REASON FOR CONSULTATION: Epigastric pain, nausea, vomiting, elevated lipase, gallstones, leukocytosis.  HISTORY OF PRESENT ILLNESS: [name] is a pleasant 59 year old female who has no significant past medical history except for a possible vasovagal episode in 2015 who presents with 1 day of epigastric pain with nausea and vomiting, radiates to the back, no fevers, no shortness of breath, no dysuria or hematuria, no diarrhea, has had similar symptoms a couple times in the last 2 months but these resolved spontaneously. Had a lipase of over 9000 in the ED as well as a bilirubin of greater than 2, an ultrasound that showed gallstones and some gallbladder wall thickening and a CT scan which showed significant pancreatitis as well as abdominal ascites. Still complains of abdominal pain.   PAST MEDICAL HISTORY:  1.  History of syncopal episode in 2015.  2.  History of Bartholin cyst excision.   HOME MEDICATIONS: Multivitamins.   ALLERGIES: No known drug allergies.   FAMILY HISTORY: Mother and sister with gallbladder disease.   SOCIAL HISTORY: Drinks alcohol, wine socially and infrequently. Denies alcohol use. No substance abuse.   REVIEW OF SYSTEMS: A 12-point review of systems was obtained. Pertinent positives and negatives as above.   PHYSICAL EXAMINATION:  VITAL SIGNS: Temperature 97.9, pulse 102, blood pressure 158/77, respirations 23 and 96% on 2 liters.  GENERAL: No acute distress, appears uncomfortable however. Alert and oriented x 3.  HEAD: Normocephalic, atraumatic.  EYES: No scleral icterus. No conjunctivitis.  FACE: No obvious facial trauma. Normal external nose. Normal external ears.  CHEST: Lungs clear to auscultation, moving air well.  HEART: Regular rate and rhythm. No murmurs, rubs  or gallops.  ABDOMEN: Soft, mildly tender in the epigastrium and less so in the right upper quadrant.  EXTREMITIES: Moves all extremities well. Strength 5/5.  NEUROLOGIC: Cranial nerves II through XII grossly intact.   LABORATORY DATA: Consistent with white cell count of 20.1, hemoglobin 18.1, hematocrit 54.9, platelets 330,000. Blood sugar of 272. Lipase is 9300. Bilirubin 2.2, AST is 344, ALT is 401.  IMAGING: Ultrasound shows gallstones, thickening of the gallbladder to 6 mm, pericholecystic fluid. CT scan shows stranding around the pancreas and intra-abdominal fluid, no obvious inflammation of gallbladder.   ASSESSMENT AND PLAN: [name] is a pleasant 59 year old female who presents with likely gallstone pancreatitis, possible choledocholithiasis, possible cholecystitis. She is relatively nontender of her right upper quadrant, so I think all of the changes could be explained by pancreatitis and possible choledocholithiasis. Would wait for pancreatitis to resolve and for us to determine plan for a possible choledocholithiasis prior to any cholecystectomy. Would recommend antibiotics to cover for possible cholecystitis. Would keep n.p.o. for now. We will continue to follow.    ____________________________ Si Raiderhristopher A. Jarek Longton, MD cal:TT D: 03/29/2014 11:27:58 ET T: 03/29/2014 15:36:27 ET JOB#: [id number]  cc: Cristal Deerhristopher A. Jameir Ake, MD, <Dictator> Jarvis NewcomerHRISTOPHER A Babak Lucus MD ELECTRONICALLY SIGNED 04/06/2014 20:11

## 2014-08-22 NOTE — Consult Note (Signed)
Chief Complaint:  Subjective/Chief Complaint The patient is much more alert but is reporting increased abd pain. The patient's WBC's are improved and lipase is down.   VITAL SIGNS/ANCILLARY NOTES: **Vital Signs.:   02-Dec-15 08:52  Vital Signs Type Routine  Pulse Pulse 108  Respirations Respirations 17  Systolic BP Systolic BP 037  Diastolic BP (mmHg) Diastolic BP (mmHg) 71  Mean BP 100  Pulse Ox % Pulse Ox % 99  Oxygen Delivery 2L   Brief Assessment:  GEN critically ill appearing   Respiratory normal resp effort   Gastrointestinal details normal Soft  Decreased bowel sounds.   Additional Physical Exam Alert and orientated times 3   Lab Results: Hepatic:  02-Dec-15 04:05   Bilirubin, Total 0.9  Alkaline Phosphatase 81 (46-116 NOTE: New Reference Range 11/18/13)  SGPT (ALT)  78 (14-63 NOTE: New Reference Range 11/18/13)  SGOT (AST)  45  Total Protein, Serum  5.1  Albumin, Serum  2.0  Routine Chem:  02-Dec-15 04:05   Magnesium, Serum 2.3 (1.8-2.4 THERAPEUTIC RANGE: 4-7 mg/dL TOXIC: > 10 mg/dL  -----------------------)  Phosphorus, Serum  1.6 (Result(s) reported on 01 Apr 2014 at 05:59AM.)  Glucose, Serum  141  BUN 11  Creatinine (comp) 0.71  Sodium, Serum 142  Potassium, Serum 4.0  Chloride, Serum  109  CO2, Serum 29  Calcium (Total), Serum  7.9  Osmolality (calc) 285  eGFR (African American) >60  eGFR (Non-African American) >60 (eGFR values <44m/min/1.73 m2 may be an indication of chronic kidney disease (CKD). Calculated eGFR, using the MRDR Study equation, is useful in  patients with stable renal function. The eGFR calculation will not be reliable in acutely ill patients when serum creatinine is changing rapidly. It is not useful in patients on dialysis. The eGFR calculation may not be applicable to patients at the low and high extremes of body sizes, pregnant women, and vegetarians.)  Anion Gap  4  Lipase  532 (Result(s) reported on 01 Apr 2014 at  05:59AM.)  Routine Hem:  02-Dec-15 04:05   WBC (CBC)  11.3  RBC (CBC) 3.88  Hemoglobin (CBC) 12.1  Hematocrit (CBC) 37.5  Platelet Count (CBC)  142  MCV 97  MCH 31.3  MCHC 32.4  RDW 13.6  Neutrophil % 82.1  Lymphocyte % 6.9  Monocyte % 10.8  Eosinophil % 0.1  Basophil % 0.1  Neutrophil #  9.3  Lymphocyte #  0.8  Monocyte #  1.2  Eosinophil # 0.0  Basophil # 0.0 (Result(s) reported on 01 Apr 2014 at 04:58AM.)   Assessment/Plan:  Assessment/Plan:  Assessment Necrotizing pancreatitis.   Plan Patient slowly improving.  Abd pain worse with decreased bowel sounds.  Will hold tube feeding for 1 hour to see if patients pain improves.   Electronic Signatures: WLucilla Lame(MD)  (Signed 02-Dec-15 09:04)  Authored: Chief Complaint, VITAL SIGNS/ANCILLARY NOTES, Brief Assessment, Lab Results, Assessment/Plan   Last Updated: 02-Dec-15 09:04 by WLucilla Lame(MD)

## 2015-09-17 ENCOUNTER — Encounter: Payer: Self-pay | Admitting: *Deleted

## 2015-09-17 ENCOUNTER — Encounter
Admission: RE | Disposition: A | Discharge status: Home / Self Care | Payer: Self-pay | Source: Ambulatory Visit | Attending: Gastroenterology

## 2015-09-17 ENCOUNTER — Ambulatory Visit: Payer: PRIVATE HEALTH INSURANCE | Admitting: Anesthesiology

## 2015-09-17 ENCOUNTER — Ambulatory Visit
Admission: RE | Admit: 2015-09-17 | Discharge: 2015-09-17 | Disposition: A | Discharge status: Home / Self Care | Payer: PRIVATE HEALTH INSURANCE | Source: Ambulatory Visit | Attending: Gastroenterology | Admitting: Gastroenterology

## 2015-09-17 DIAGNOSIS — Z1211 Encounter for screening for malignant neoplasm of colon: Secondary | ICD-10-CM | POA: Diagnosis not present

## 2015-09-17 DIAGNOSIS — Z885 Allergy status to narcotic agent status: Secondary | ICD-10-CM | POA: Insufficient documentation

## 2015-09-17 DIAGNOSIS — Z79899 Other long term (current) drug therapy: Secondary | ICD-10-CM | POA: Insufficient documentation

## 2015-09-17 HISTORY — PX: COLONOSCOPY WITH PROPOFOL: SHX5780

## 2015-09-17 SURGERY — COLONOSCOPY WITH PROPOFOL
Anesthesia: General

## 2015-09-17 MED ORDER — MIDAZOLAM HCL 2 MG/2ML IJ SOLN
INTRAMUSCULAR | Status: DC | PRN
  Administered 2015-09-17: 1 mg via INTRAVENOUS

## 2015-09-17 MED ORDER — FENTANYL CITRATE (PF) 100 MCG/2ML IJ SOLN
INTRAMUSCULAR | Status: DC | PRN
  Administered 2015-09-17: 50 ug via INTRAVENOUS

## 2015-09-17 MED ORDER — PROPOFOL 500 MG/50ML IV EMUL
INTRAVENOUS | Status: DC | PRN
  Administered 2015-09-17: 125 ug/kg/min via INTRAVENOUS

## 2015-09-17 MED ORDER — SODIUM CHLORIDE 0.9 % IV SOLN: INTRAVENOUS | Status: DC

## 2015-09-17 MED ORDER — PROPOFOL 10 MG/ML IV BOLUS
INTRAVENOUS | Status: DC | PRN
  Administered 2015-09-17: 50 mg via INTRAVENOUS

## 2015-09-17 MED ORDER — EPHEDRINE SULFATE 50 MG/ML IJ SOLN
INTRAMUSCULAR | Status: DC | PRN
  Administered 2015-09-17: 10 mg via INTRAVENOUS

## 2015-09-17 MED ORDER — SODIUM CHLORIDE 0.9 % IV SOLN
INTRAVENOUS | Status: DC
  Administered 2015-09-17 (×2): via INTRAVENOUS

## 2015-09-17 NOTE — Transfer of Care (Signed)
Immediate Anesthesia Transfer of Care Note  Patient: Darrin NipperSharon Hymon  Procedure(s) Performed: Procedure(s): COLONOSCOPY WITH PROPOFOL (N/A)  Patient Location: PACU  Anesthesia Type:General  Level of Consciousness: sedated  Airway & Oxygen Therapy: Patient Spontanous Breathing and Patient connected to nasal cannula oxygen  Post-op Assessment: Report given to RN and Post -op Vital signs reviewed and stable  Post vital signs: Reviewed and stable  Last Vitals:  Filed Vitals:   09/17/15 0655  BP: 135/54  Pulse: 63  Temp: 37.1 C  Resp: 18    Last Pain: There were no vitals filed for this visit.       Complications: No apparent anesthesia complications

## 2015-09-17 NOTE — Anesthesia Postprocedure Evaluation (Signed)
Anesthesia Post Note  Patient: Darrin NipperSharon Haros  Procedure(s) Performed: Procedure(s) (LRB): COLONOSCOPY WITH PROPOFOL (N/A)  Patient location during evaluation: Endoscopy Anesthesia Type: General Level of consciousness: awake and alert Pain management: pain level controlled Vital Signs Assessment: post-procedure vital signs reviewed and stable Respiratory status: spontaneous breathing, nonlabored ventilation, respiratory function stable and patient connected to nasal cannula oxygen Cardiovascular status: blood pressure returned to baseline and stable Postop Assessment: no signs of nausea or vomiting Anesthetic complications: no    Last Vitals:  Filed Vitals:   09/17/15 0821 09/17/15 0831  BP: 116/55 111/54  Pulse: 64 59  Temp:    Resp: 15 12    Last Pain: There were no vitals filed for this visit.               Lenard SimmerAndrew Ura Yingling

## 2015-09-17 NOTE — H&P (Signed)
Outpatient short stay form Pre-procedure 09/17/2015 7:40 AM [name] Anne Janota MD  Primary Physician: Dr Kandyce RudMarcus Babaoff  Reason for visit:  Screening colonoscopy  History of present illness:  Patient is a 60 year old female presenting today for colonoscopy. This is her first colonoscopy. She tolerated her prep well. She takes no aspirin or blood thinning products.  Current facility-administered medications:  .  0.9 %  sodium chloride infusion, , Intravenous, Continuous, [name] [name], MD, Last Rate: 20 mL/hr at 09/17/15 0707 .  0.9 %  sodium chloride infusion, , Intravenous, Continuous, [name] [name], MD  Prescriptions prior to admission  Medication Sig Dispense Refill Last Dose  . Ascorbic Acid (VITAMIN C) 100 MG tablet Take by mouth.   Past Week at Unknown time  . b complex vitamins tablet Take 1 tablet by mouth daily.   Past Week at Unknown time  . Fish Oil-Cholecalciferol (FISH OIL + D3) 1000-1000 MG-UNIT CAPS Take by mouth.   Past Month at Unknown time  . calcium carbonate (OS-CAL - DOSED IN MG OF ELEMENTAL CALCIUM) 1250 MG tablet Take by mouth.     . Coenzyme Q10 (CO Q 10) 10 MG CAPS Take by mouth.     . magnesium sulfate 50 % injection Take by mouth.     [name]. OVER THE COUNTER MEDICATION Alfalfa tablet     . thiamine 100 MG tablet Take by mouth.        Allergies  Allergen Reactions  . Oxycodone Itching     History reviewed. No pertinent past medical history.  Review of systems:      Physical Exam    Heart and lungs: Regular rate and rhythm without rub or gallop, lungs are bilaterally clear.    HEENT:  normocephalic atraumatic eyes are anicteric    Other:     Pertinant exam for procedure:  soft nontender nondistended bowel sounds positive normoactive.    Planned proceedures:  colonoscopy and indicated procedures. I have discussed the risks benefits and complications of procedures to include not limited to bleeding, infection, perforation and the risk of  sedation and the patient wishes to proceed.    [name] Graviel Payeur, MD Gastroenterology 09/17/2015  7:40 AM

## 2015-09-17 NOTE — Anesthesia Preprocedure Evaluation (Signed)
Anesthesia Evaluation  Patient identified by MRN, date of birth, ID band Patient awake    Reviewed: Allergy & Precautions, H&P , NPO status , Patient's Chart, lab work & pertinent test results, reviewed documented beta blocker date and time   History of Anesthesia Complications Negative for: history of anesthetic complications  Airway Mallampati: III  TM Distance: >3 FB Neck ROM: full    Dental no notable dental hx. (+) Teeth Intact   Pulmonary neg shortness of breath, neg sleep apnea, neg COPD, Recent URI , Resolved,    Pulmonary exam normal breath sounds clear to auscultation       Cardiovascular Exercise Tolerance: Good negative cardio ROS Normal cardiovascular exam Rhythm:regular Rate:Normal     Neuro/Psych negative neurological ROS  negative psych ROS   GI/Hepatic negative GI ROS, Neg liver ROS,   Endo/Other  negative endocrine ROS  Renal/GU negative Renal ROS  negative genitourinary   Musculoskeletal   Abdominal   Peds  Hematology negative hematology ROS (+)   Anesthesia Other Findings History reviewed. No pertinent past medical history.   Reproductive/Obstetrics negative OB ROS                             Anesthesia Physical Anesthesia Plan  ASA: I  Anesthesia Plan: General   Post-op Pain Management:    Induction:   Airway Management Planned:   Additional Equipment:   Intra-op Plan:   Post-operative Plan:   Informed Consent: I have reviewed the patients History and Physical, chart, labs and discussed the procedure including the risks, benefits and alternatives for the proposed anesthesia with the patient or authorized representative who has indicated his/her understanding and acceptance.   Dental Advisory Given  Plan Discussed with: Anesthesiologist, CRNA and Surgeon  Anesthesia Plan Comments:         Anesthesia Quick Evaluation

## 2015-09-17 NOTE — Op Note (Addendum)
[facility] Gastroenterology Patient Name: Anne NipperSharon Apel Procedure Date: 09/17/2015 7:39 AM MRN: [id number] Account #: [id number] Date of Birth: 05/11/1955 Admit Type: Outpatient Age: [id number] [facility] Gender: Female Note Status: Supervisor Override Procedure:            Colonoscopy Providers:            Christena DeemMartin U. Shekita Boyden, MD Referring MD:         Gwendalyn Egeourtney C. Babaoff (Referring MD) Medicines:            Monitored Anesthesia Care Complications:        No immediate complications. Procedure:            Pre-Anesthesia Assessment:                       - ASA Grade Assessment: I - A normal, healthy patient.                       After obtaining informed consent, the colonoscope was                        passed under direct vision. Throughout the procedure,                        the patient's blood pressure, pulse, and oxygen                        saturations were monitored continuously. The                        Colonoscope was introduced through the anus with the                        intention of advancing to the cecum. The scope was                        advanced to the sigmoid colon before the procedure was                        aborted. Medications were given. The colonoscopy was                        unusually difficult due to poor bowel prep. The patient                        tolerated the procedure well. The quality of the bowel                        preparation was poor. Findings:      A large amount of semi-liquid stool was found in the rectum and in the       sigmoid colon, precluding visualization.      The digital rectal exam was normal. Impression:           - Preparation of the colon was poor.                       - Stool in the rectum and in the sigmoid colon.                       -  No specimens collected. Recommendation:       - Discharge patient to home. Procedure Code(s):    --- Professional ---                       [phone],  53, Colonoscopy, flexible; diagnostic, including                        collection of specimen(s) by brushing or washing, when                        performed (separate procedure) CPT copyright 2016 [employer]. All rights reserved. The codes documented in this report are preliminary and upon coder review may  be revised to meet current compliance requirements. Christena Deem, MD 09/17/2015 7:59:14 AM This report has been signed electronically. Number of Addenda: 0 Note Initiated On: 09/17/2015 7:39 AM Total Procedure Duration: 0 hours 6 minutes 10 seconds       [facility]

## 2015-09-21 ENCOUNTER — Encounter: Payer: Self-pay | Admitting: Gastroenterology

## 2016-04-07 IMAGING — MR MRI HEAD WITHOUT CONTRAST
10 series · 40 of 48 positions shown · non-contrast
Comparison: None.

CLINICAL DATA: Syncopal episode follow-up by headache.

EXAM:
MRI HEAD WITHOUT CONTRAST
TECHNIQUE: Multiplanar, multiecho pulse sequences of the brain and surrounding
structures were obtained without intravenous contrast.

[Series 2: T1 · sagittal · 5.0mm · 0.45mm/px · 3 of 25 slices shown (1 of 2)]
[im 1/25]
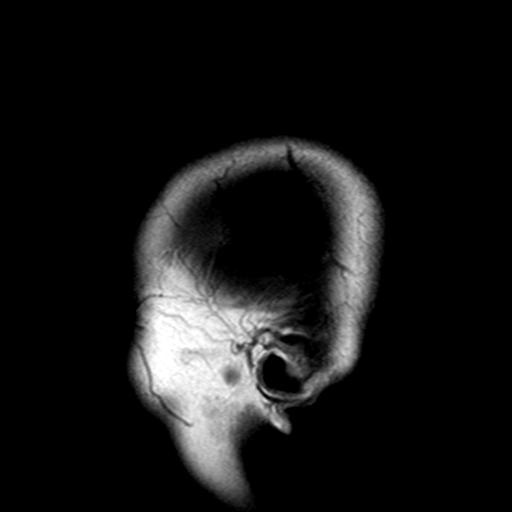
[im 13/25]
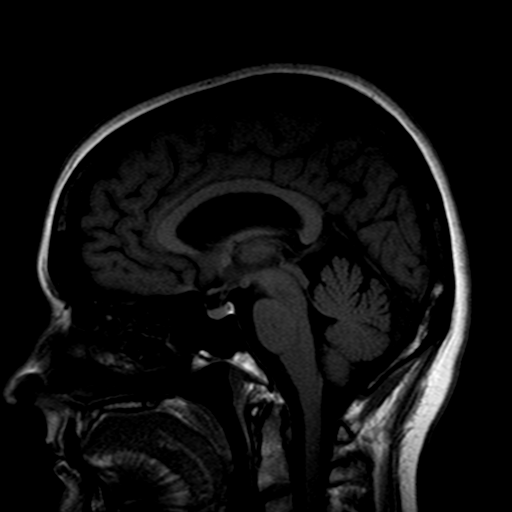
[im 25/25]
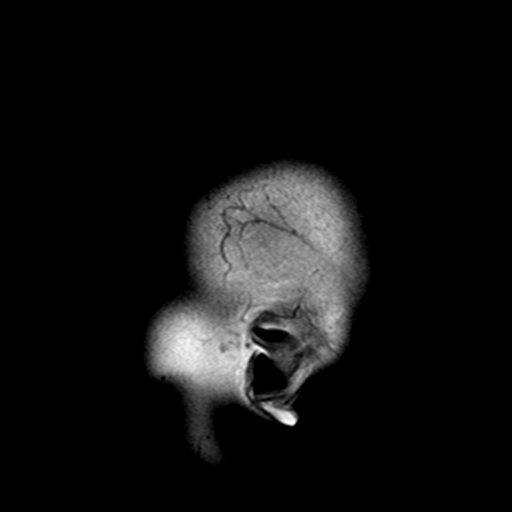

[Series 4: DWI · axial · 5.0mm · 1.80mm/px · z∈[-47,+108]mm · 4 of 25 slices shown (1 of 4)]
[im 1/25]
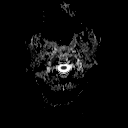
[im 9/25]
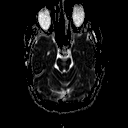
[im 17/25]
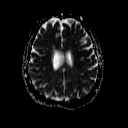
[im 25/25]
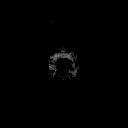

[Series 5: DWI · axial · 5.0mm · 1.80mm/px · z∈[-47,+108]mm · 4 of 25 slices shown (2 of 4)]
[im 1/25]
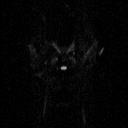
[im 9/25]
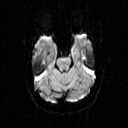
[im 17/25]
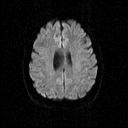
[im 25/25]
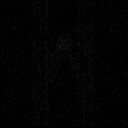

[Series 7: DWI · coronal · 5.0mm · 1.80mm/px · 6 of 37 slices shown (3 of 4)]
[im 1/37]
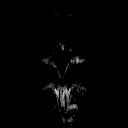
[im 8/37]
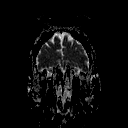
[im 15/37]
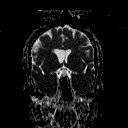
[im 22/37]
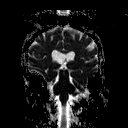
[im 29/37]
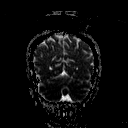
[im 37/37]
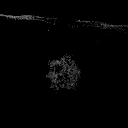

[Series 8: DWI · coronal · 5.0mm · 1.80mm/px · 5 of 35 slices shown (4 of 4)]
[im 1/35]
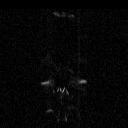
[im 9/35]
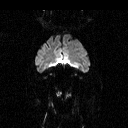
[im 18/35]
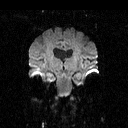
[im 26/35]
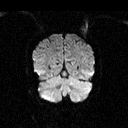
[im 35/35]
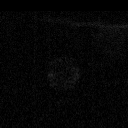

[Series 9: T2 · axial · 5.0mm · 0.45mm/px · z∈[-47,+108]mm · 4 of 25 slices shown (1 of 3)]
[im 1/25]
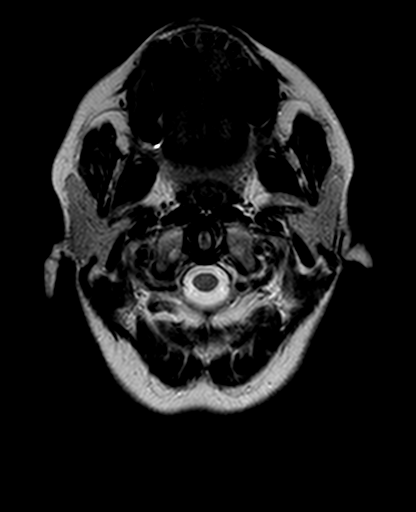
[im 9/25]
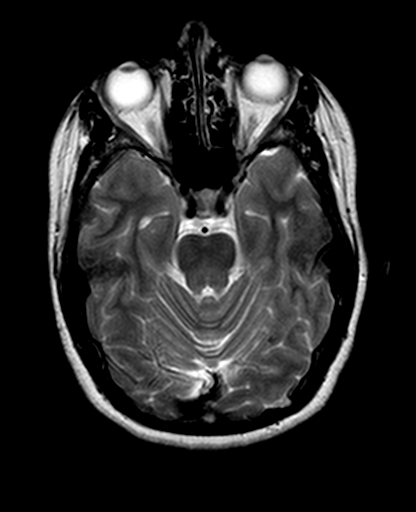
[im 17/25]
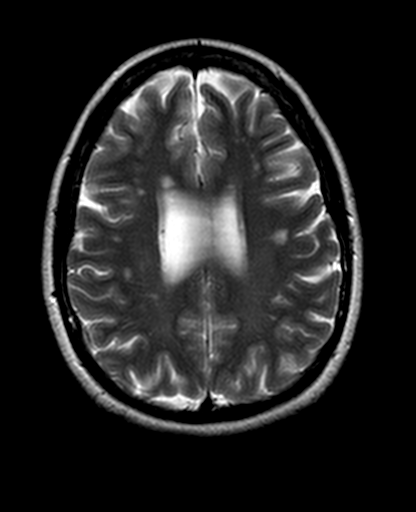
[im 25/25]
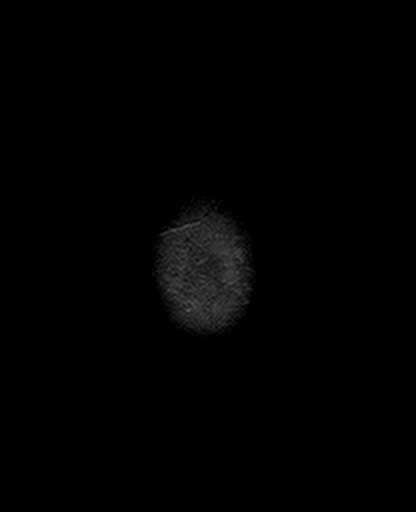

[Series 10: FLAIR · axial · 5.0mm · 0.45mm/px · z∈[-47,+108]mm · 4 of 25 slices shown]
[im 1/25]
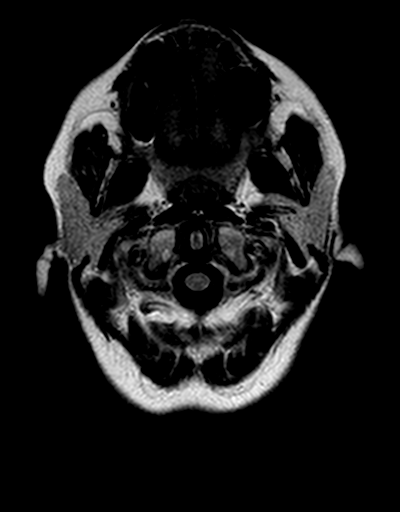
[im 9/25]
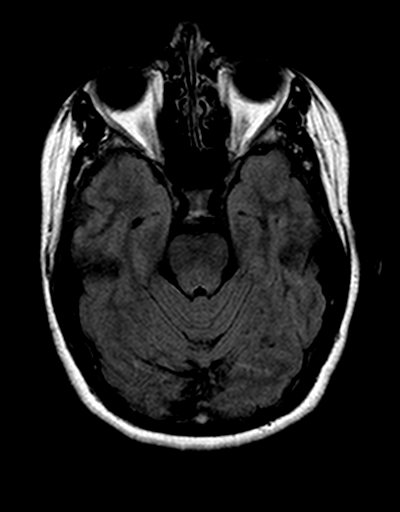
[im 17/25]
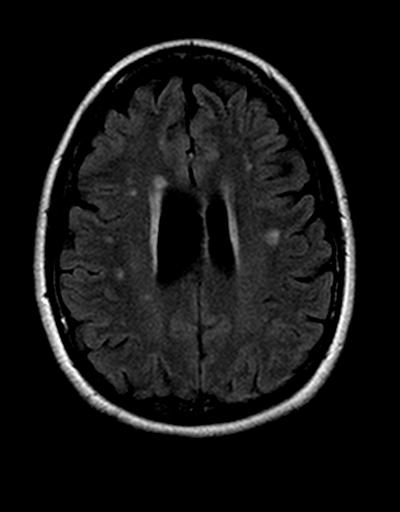
[im 25/25]
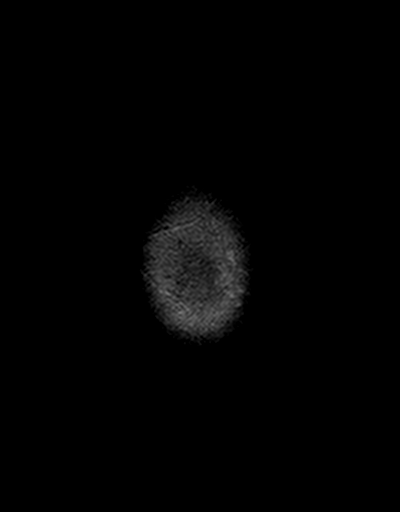

[Series 11: T2 · axial · 5.0mm · 0.45mm/px · z∈[-47,+108]mm · 4 of 25 slices shown (2 of 3)]
[im 1/25]
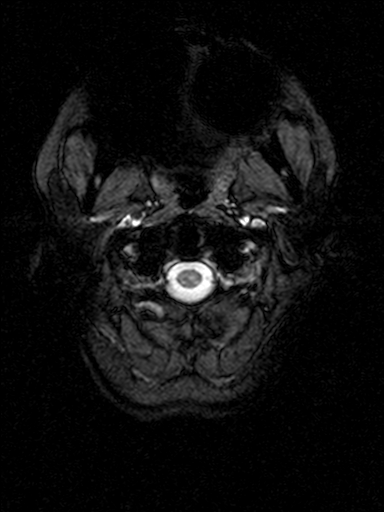
[im 9/25]
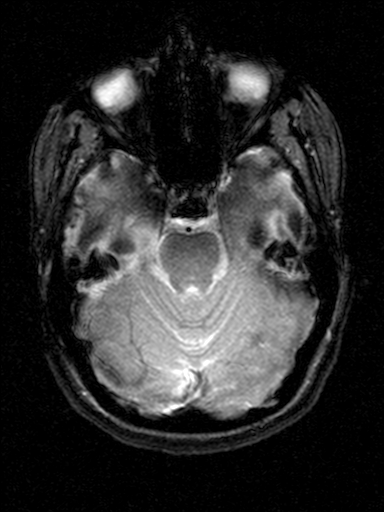
[im 17/25]
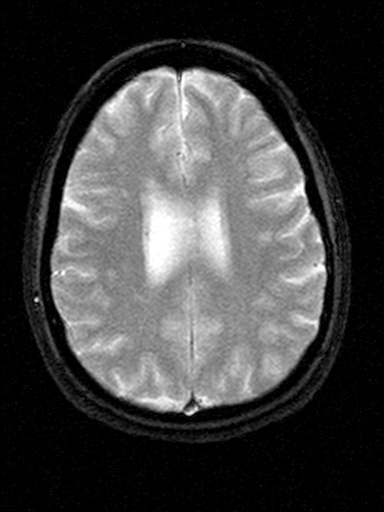
[im 25/25]
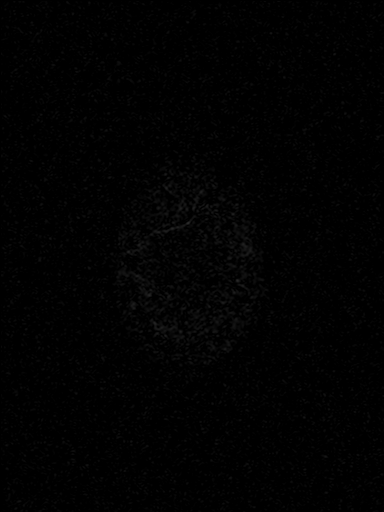

[Series 12: T1 · axial · 3.0mm · 0.45mm/px · 1 of 60 slices shown (2 of 2)]
[im 1/60]
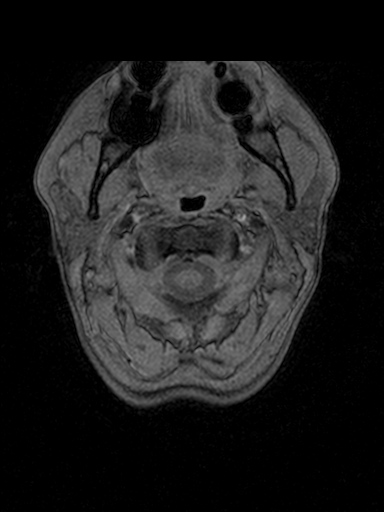

[Series 13: T2 · coronal · 5.0mm · 0.45mm/px · 5 of 30 slices shown (3 of 3)]
[im 1/30]
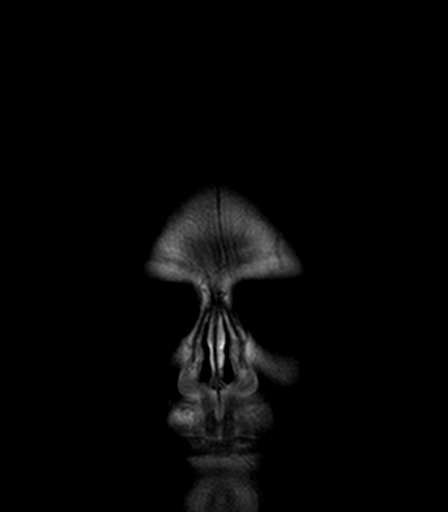
[im 8/30]
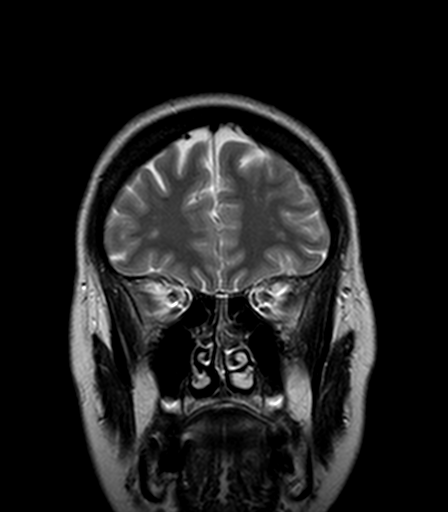
[im 15/30]
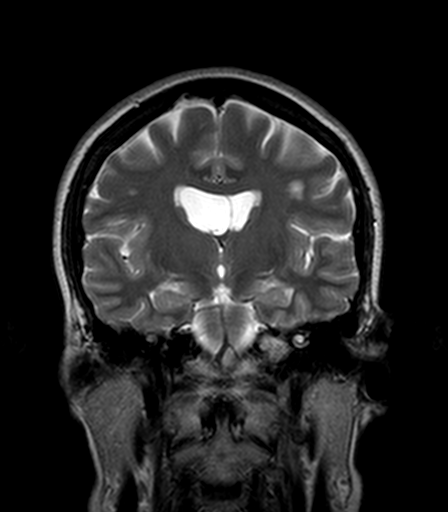
[im 22/30]
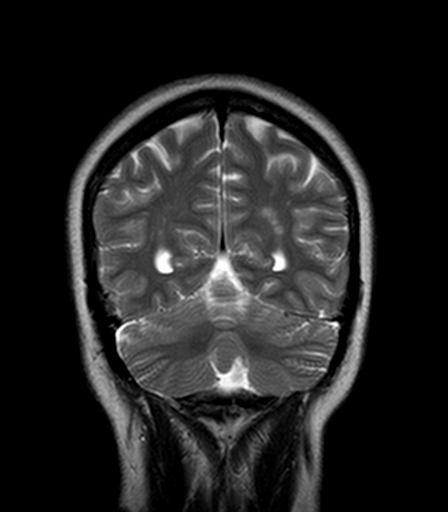
[im 30/30]
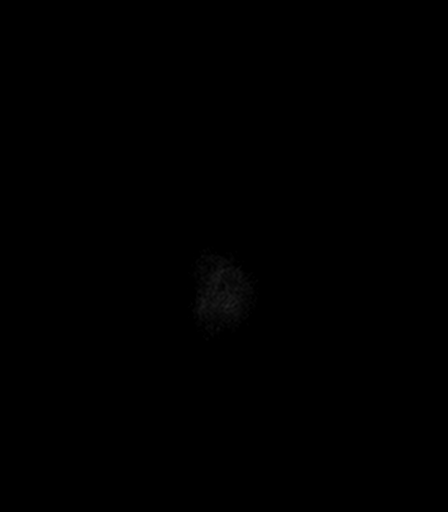

[40 of 48 positions shown; findings below may reference images not displayed]

FINDINGS: Mild confluent periventricular and moderate scattered subcortical
punctate T2 hyperintensities are present bilaterally. No acute
infarct, hemorrhage, or mass lesion is present.

Flow is present in the major intracranial arteries. The globes and
orbits are intact. Mild mucosal thickening is present in the
paranasal sinuses bilaterally.
IMPRESSION: 1. Periventricular and scattered subcortical white matter changes
bilaterally are greater than expected for age. The finding is
nonspecific but can be seen in the setting of chronic microvascular
ischemia, a demyelinating process such as multiple sclerosis,
vasculitis, complicated migraine headaches, or as the sequelae of a
prior infectious or inflammatory process.
2. No acute abnormality.

## 2021-06-13 ENCOUNTER — Other Ambulatory Visit: Payer: Self-pay | Admitting: Family

## 2021-06-13 DIAGNOSIS — Z1231 Encounter for screening mammogram for malignant neoplasm of breast: Secondary | ICD-10-CM

## 2021-10-05 ENCOUNTER — Ambulatory Visit
Admission: RE | Admit: 2021-10-05 | Discharge: 2021-10-05 | Disposition: A | Discharge status: Home / Self Care | Payer: Medicare HMO | Source: Ambulatory Visit | Attending: Family | Admitting: Family

## 2021-10-05 DIAGNOSIS — Z1231 Encounter for screening mammogram for malignant neoplasm of breast: Secondary | ICD-10-CM | POA: Insufficient documentation

## 2023-12-11 ENCOUNTER — Other Ambulatory Visit: Payer: Self-pay | Admitting: Family Medicine

## 2023-12-11 DIAGNOSIS — Z1231 Encounter for screening mammogram for malignant neoplasm of breast: Secondary | ICD-10-CM
